# Patient Record
Sex: Female | Born: 1990 | Race: White | Hispanic: No | Marital: Single
Health system: Southern US, Community
[De-identification: ages and names within clinical notes are randomized; demographics above are authoritative.]

## PROBLEM LIST (undated history)

## (undated) HISTORY — PX: BACK SURGERY: SHX140

---

## 2018-11-27 ENCOUNTER — Emergency Department (HOSPITAL_COMMUNITY): Payer: No Typology Code available for payment source

## 2018-11-27 ENCOUNTER — Emergency Department (HOSPITAL_COMMUNITY)
Admission: EM | Admit: 2018-11-27 | Discharge: 2018-11-27 | Disposition: A | Discharge status: Home / Self Care | Payer: No Typology Code available for payment source | Attending: Emergency Medicine | Admitting: Emergency Medicine

## 2018-11-27 ENCOUNTER — Encounter (HOSPITAL_COMMUNITY): Payer: Self-pay | Admitting: Emergency Medicine

## 2018-11-27 DIAGNOSIS — M545 Low back pain, unspecified: Secondary | ICD-10-CM

## 2018-11-27 DIAGNOSIS — R509 Fever, unspecified: Secondary | ICD-10-CM | POA: Insufficient documentation

## 2018-11-27 DIAGNOSIS — M546 Pain in thoracic spine: Secondary | ICD-10-CM | POA: Insufficient documentation

## 2018-11-27 LAB — CBC WITH DIFFERENTIAL/PLATELET
Abs Immature Granulocytes: 0.01 10*3/uL (ref 0.00–0.07)
Basophils Absolute: 0 10*3/uL (ref 0.0–0.1)
Basophils Relative: 0 %
Eosinophils Absolute: 0.2 10*3/uL (ref 0.0–0.5)
Eosinophils Relative: 2 %
HEMATOCRIT: 38 % (ref 36.0–46.0)
Hemoglobin: 12.3 g/dL (ref 12.0–15.0)
Immature Granulocytes: 0 %
LYMPHS ABS: 2.9 10*3/uL (ref 0.7–4.0)
Lymphocytes Relative: 43 %
MCH: 30.9 pg (ref 26.0–34.0)
MCHC: 32.4 g/dL (ref 30.0–36.0)
MCV: 95.5 fL (ref 80.0–100.0)
Monocytes Absolute: 0.4 10*3/uL (ref 0.1–1.0)
Monocytes Relative: 6 %
Neutro Abs: 3.2 10*3/uL (ref 1.7–7.7)
Neutrophils Relative %: 49 %
Platelets: 200 10*3/uL (ref 150–400)
RBC: 3.98 MIL/uL (ref 3.87–5.11)
RDW: 12.1 % (ref 11.5–15.5)
WBC: 6.7 10*3/uL (ref 4.0–10.5)
nRBC: 0 % (ref 0.0–0.2)

## 2018-11-27 LAB — I-STAT CG4 LACTIC ACID, ED
Lactic Acid, Venous: 0.36 mmol/L — ABNORMAL LOW (ref 0.5–1.9)
Lactic Acid, Venous: 0.43 mmol/L — ABNORMAL LOW (ref 0.5–1.9)

## 2018-11-27 LAB — COMPREHENSIVE METABOLIC PANEL
ALBUMIN: 3.9 g/dL (ref 3.5–5.0)
ALT: 10 U/L (ref 0–44)
AST: 14 U/L — ABNORMAL LOW (ref 15–41)
Alkaline Phosphatase: 44 U/L (ref 38–126)
Anion gap: 9 (ref 5–15)
BUN: 12 mg/dL (ref 6–20)
CO2: 22 mmol/L (ref 22–32)
Calcium: 9 mg/dL (ref 8.9–10.3)
Chloride: 107 mmol/L (ref 98–111)
Creatinine, Ser: 0.65 mg/dL (ref 0.44–1.00)
GFR calc Af Amer: >60 mL/min (ref >60)
GFR calc non Af Amer: >60 mL/min (ref >60)
Glucose, Bld: 87 mg/dL (ref 70–99)
Potassium: 3.9 mmol/L (ref 3.5–5.1)
Sodium: 138 mmol/L (ref 135–145)
Total Bilirubin: 0.9 mg/dL (ref 0.3–1.2)
Total Protein: 6.3 g/dL — ABNORMAL LOW (ref 6.5–8.1)

## 2018-11-27 LAB — SEDIMENTATION RATE: Sed Rate: 8 mm/hr (ref 0–22)

## 2018-11-27 LAB — C-REACTIVE PROTEIN: CRP: <0.8 mg/dL (ref <1.0)

## 2018-11-27 LAB — POC URINE PREG, ED: Preg Test, Ur: NEGATIVE

## 2018-11-27 MED ORDER — METHOCARBAMOL 1000 MG/10ML IJ SOLN
1000.0000 mg | Freq: Once | INTRAMUSCULAR | Status: DC
  Filled 2018-11-27 (×2): qty 10

## 2018-11-27 MED ORDER — OXYCODONE-ACETAMINOPHEN 5-325 MG PO TABS
1.0000 | ORAL_TABLET | Freq: Once | ORAL | Status: AC
  Administered 2018-11-27: 1 via ORAL
  Filled 2018-11-27: qty 1

## 2018-11-27 MED ORDER — HYDROMORPHONE HCL 1 MG/ML IJ SOLN
1.0000 mg | Freq: Once | INTRAMUSCULAR | Status: AC
  Administered 2018-11-27: 1 mg via INTRAMUSCULAR
  Filled 2018-11-27: qty 1

## 2018-11-27 MED ORDER — METHOCARBAMOL 500 MG PO TABS
1000.0000 mg | ORAL_TABLET | Freq: Once | ORAL | Status: AC
  Administered 2018-11-27: 1000 mg via ORAL

## 2018-11-27 MED ORDER — HYDROMORPHONE HCL 1 MG/ML IJ SOLN: 1.0000 mg | Freq: Once | INTRAMUSCULAR | Status: DC

## 2018-11-27 MED ORDER — METHOCARBAMOL 500 MG PO TABS: 500.0000 mg | ORAL_TABLET | Freq: Two times a day (BID) | ORAL | 0 refills | Status: AC

## 2018-11-27 MED ORDER — OXYCODONE-ACETAMINOPHEN 5-325 MG PO TABS: 1.0000 | ORAL_TABLET | Freq: Four times a day (QID) | ORAL | 0 refills | Status: AC | PRN

## 2018-11-27 MED ORDER — LIDOCAINE 5 % EX PTCH
1.0000 | MEDICATED_PATCH | CUTANEOUS | Status: DC
  Administered 2018-11-27: 1 via TRANSDERMAL
  Filled 2018-11-27: qty 1

## 2018-11-27 NOTE — ED Notes (Signed)
Pt verbalized understanding of discharge paperwork, prescriptions, and follow-up care. 
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
## 2018-11-27 NOTE — Discharge Instructions (Addendum)
Thank you for allowing me to care for you today in the Emergency Department.   Call to follow up appointment with the neurosurgery clinic.   Take 600 mg of ibuprofen with food or 650 mg of Tylenol every 6 hours.  You can also take 1 tablet of Robaxin, a muscle relaxer, 2 times daily.  For severe, uncontrollable pain, take 1 tablet of Percocet every 6 hours as needed for severe pain.  This is a narcotic.  It can be addicting.  Do not work or drive while taking this medication.  Lidocaine patches are available over-the-counter and can be applied to areas that are sore.  Apply heat or ice for 15 to 20 minutes as frequently as needed to see if your pain improves.  Do not use Robaxin and Percocet at the same time as both of these can cause you to be more drowsy.  Do not drink alcohol if you are taking Percocet.  Return to the emergency department if you develop high fevers that do not improve with Tylenol or ibuprofen, confusion, if you start peeing or pooping on herself, uncontrollable back pain, or other new, concerning symptoms.
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
## 2020-04-10 IMAGING — DX DG LUMBAR SPINE COMPLETE 4+V
5 series · 5 of 5 positions shown · non-contrast
Comparison: CT scan of November 08, 2016.

CLINICAL DATA: Lower back pain after injury today.

EXAM:
LUMBAR SPINE - COMPLETE 4+ VIEW

[t lumbar spine ap]
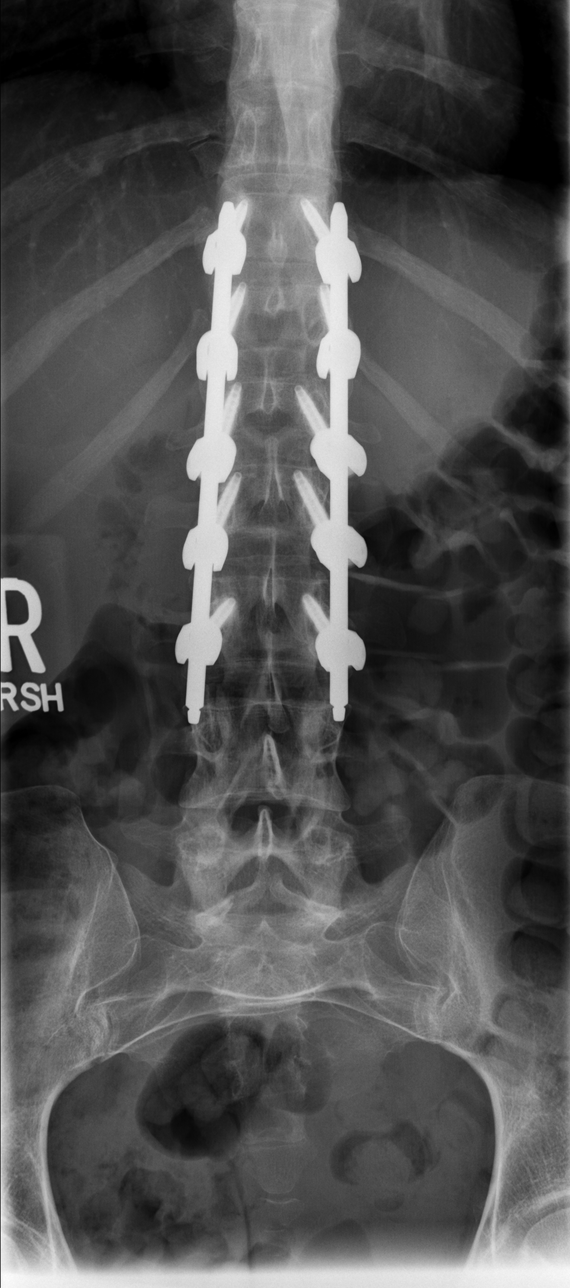

[t lumbar spine obl (1 of 2)]
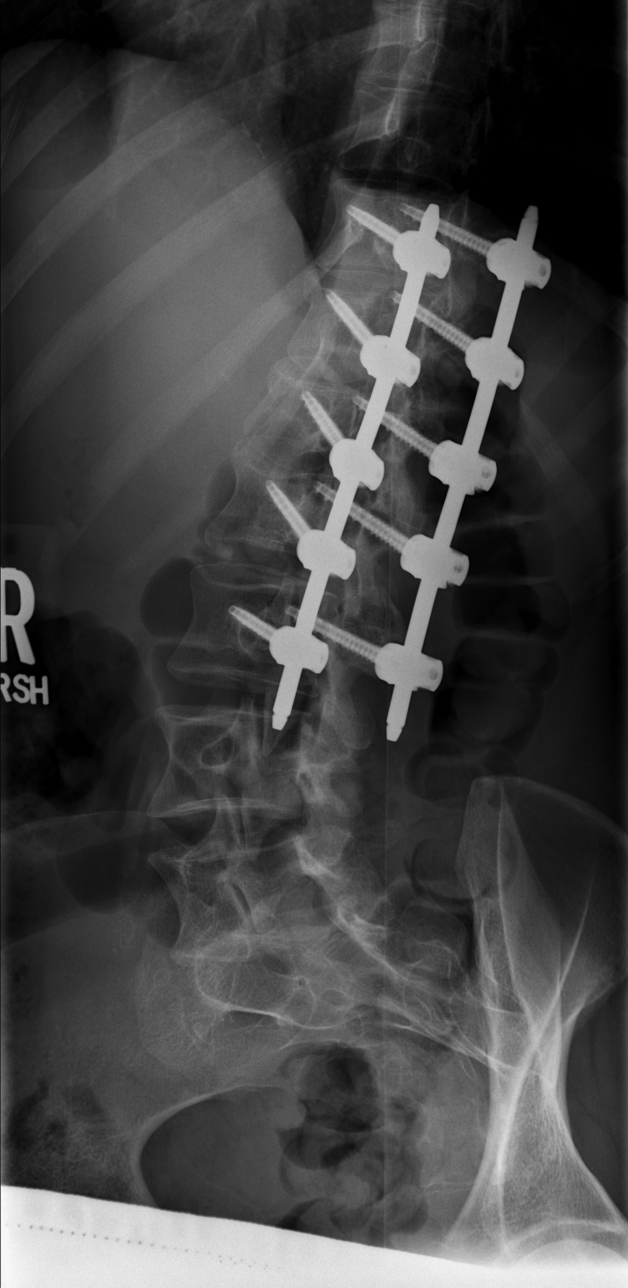

[t lumbar spine obl (2 of 2)]
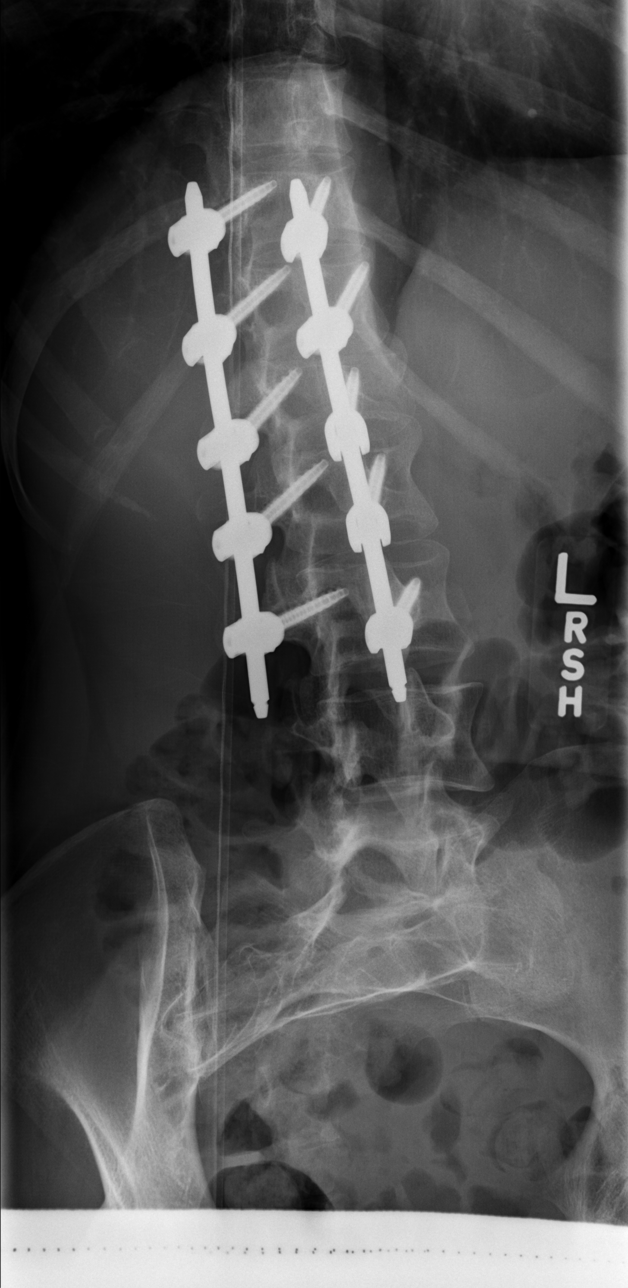

[t lumbar spine lat]
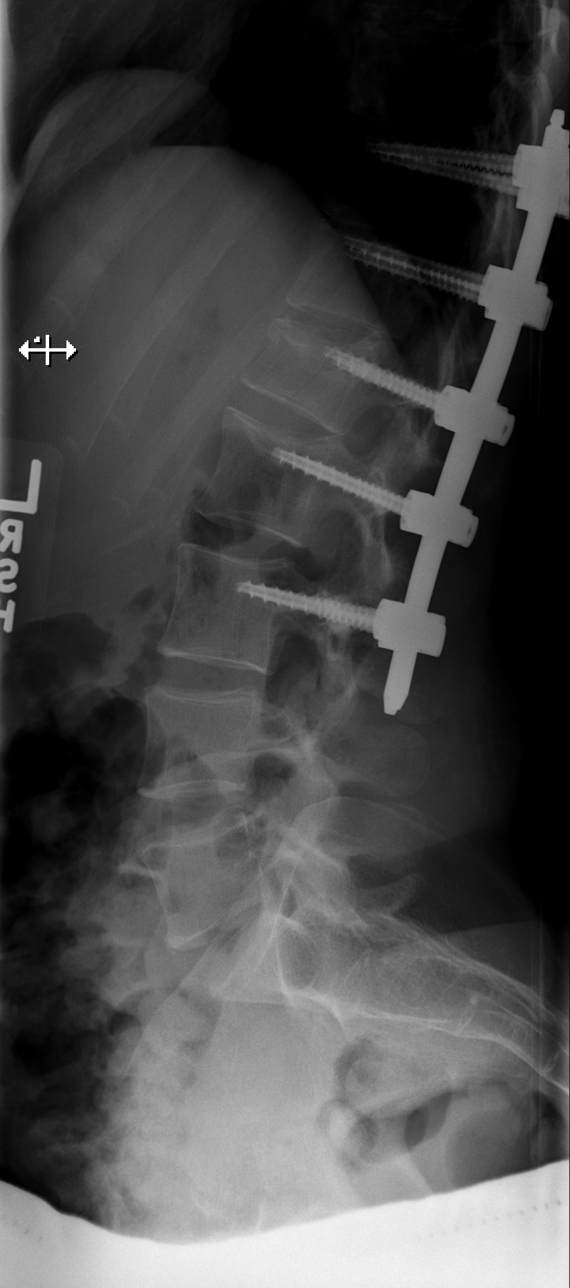

[t lumbar l-5 s-1 spot]
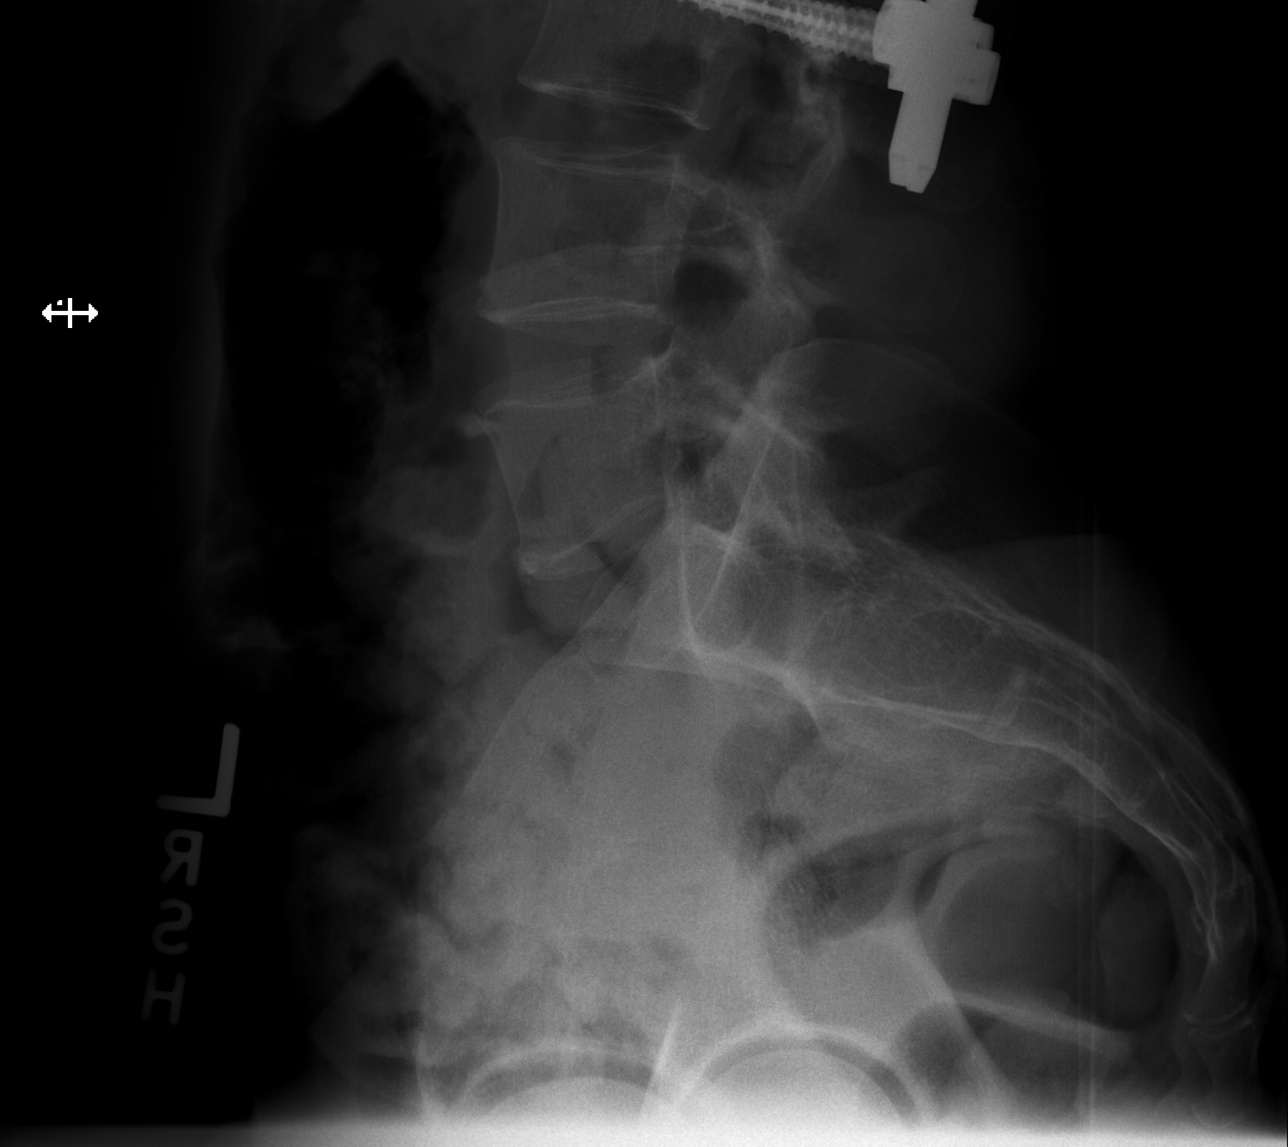

[5 of 5 positions shown; findings below may reference images not displayed]

FINDINGS: Status post surgical posterior fusion extending from T11-L3 with
bilateral intrapedicular screw placement. Old L1 compression
fracture is noted. No acute fracture or spondylolisthesis is noted.
Visualized disc spaces are unremarkable.
IMPRESSION: Postsurgical changes as described above. Old L1 compression fracture
is noted. No acute fracture or spondylolisthesis is noted.

## 2020-04-10 IMAGING — DX DG THORACIC SPINE 2V
3 series · 3 of 3 positions shown · non-contrast
Comparison: CT scan of November 08, 2016.

CLINICAL DATA: Thoracic back pain after injury today.

EXAM:
THORACIC SPINE 2 VIEWS

[t thoracic spine ap]
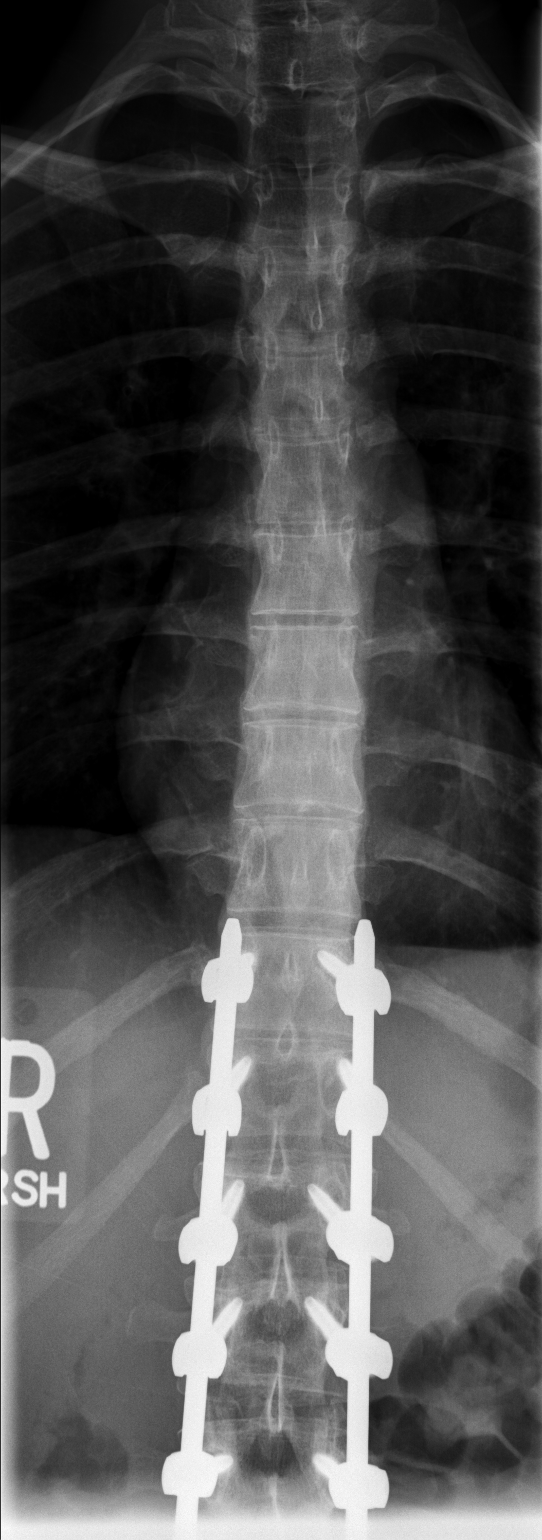

[t thoracic spine lat]
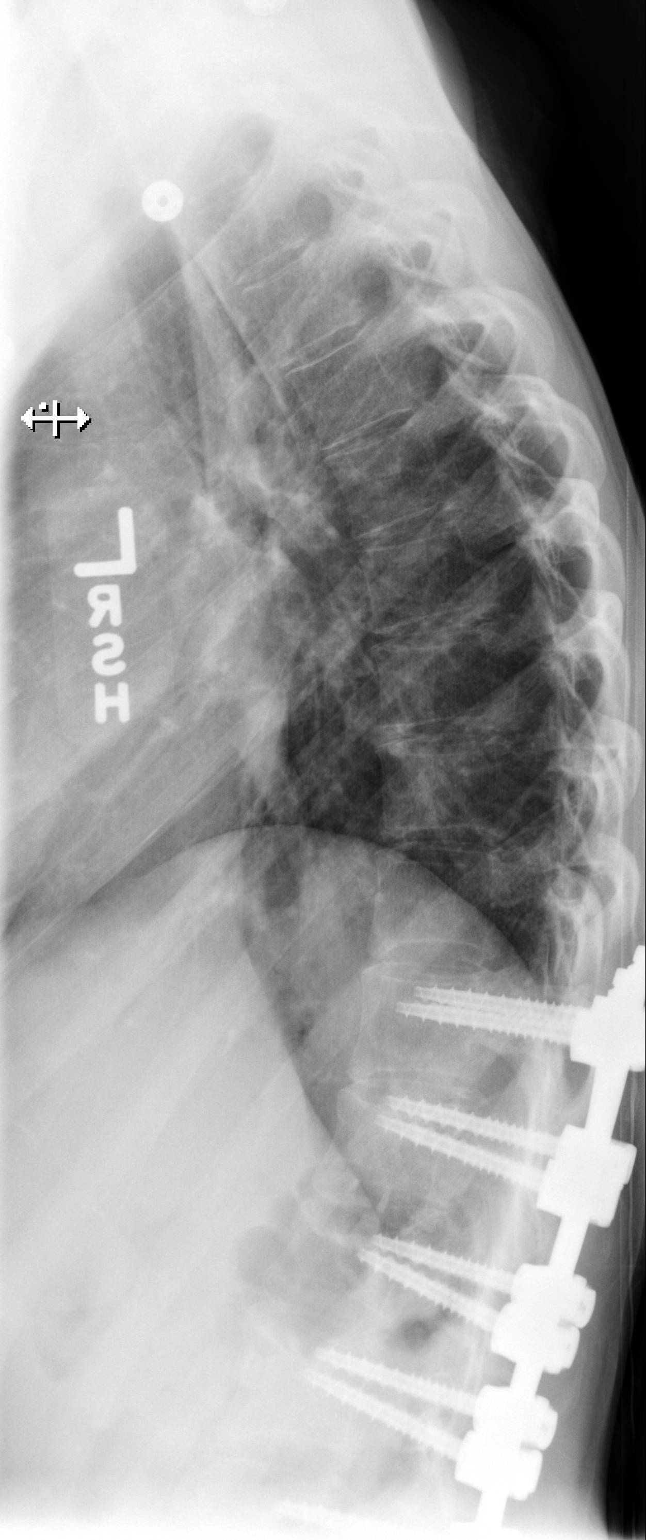

[t thoracic swimmers]
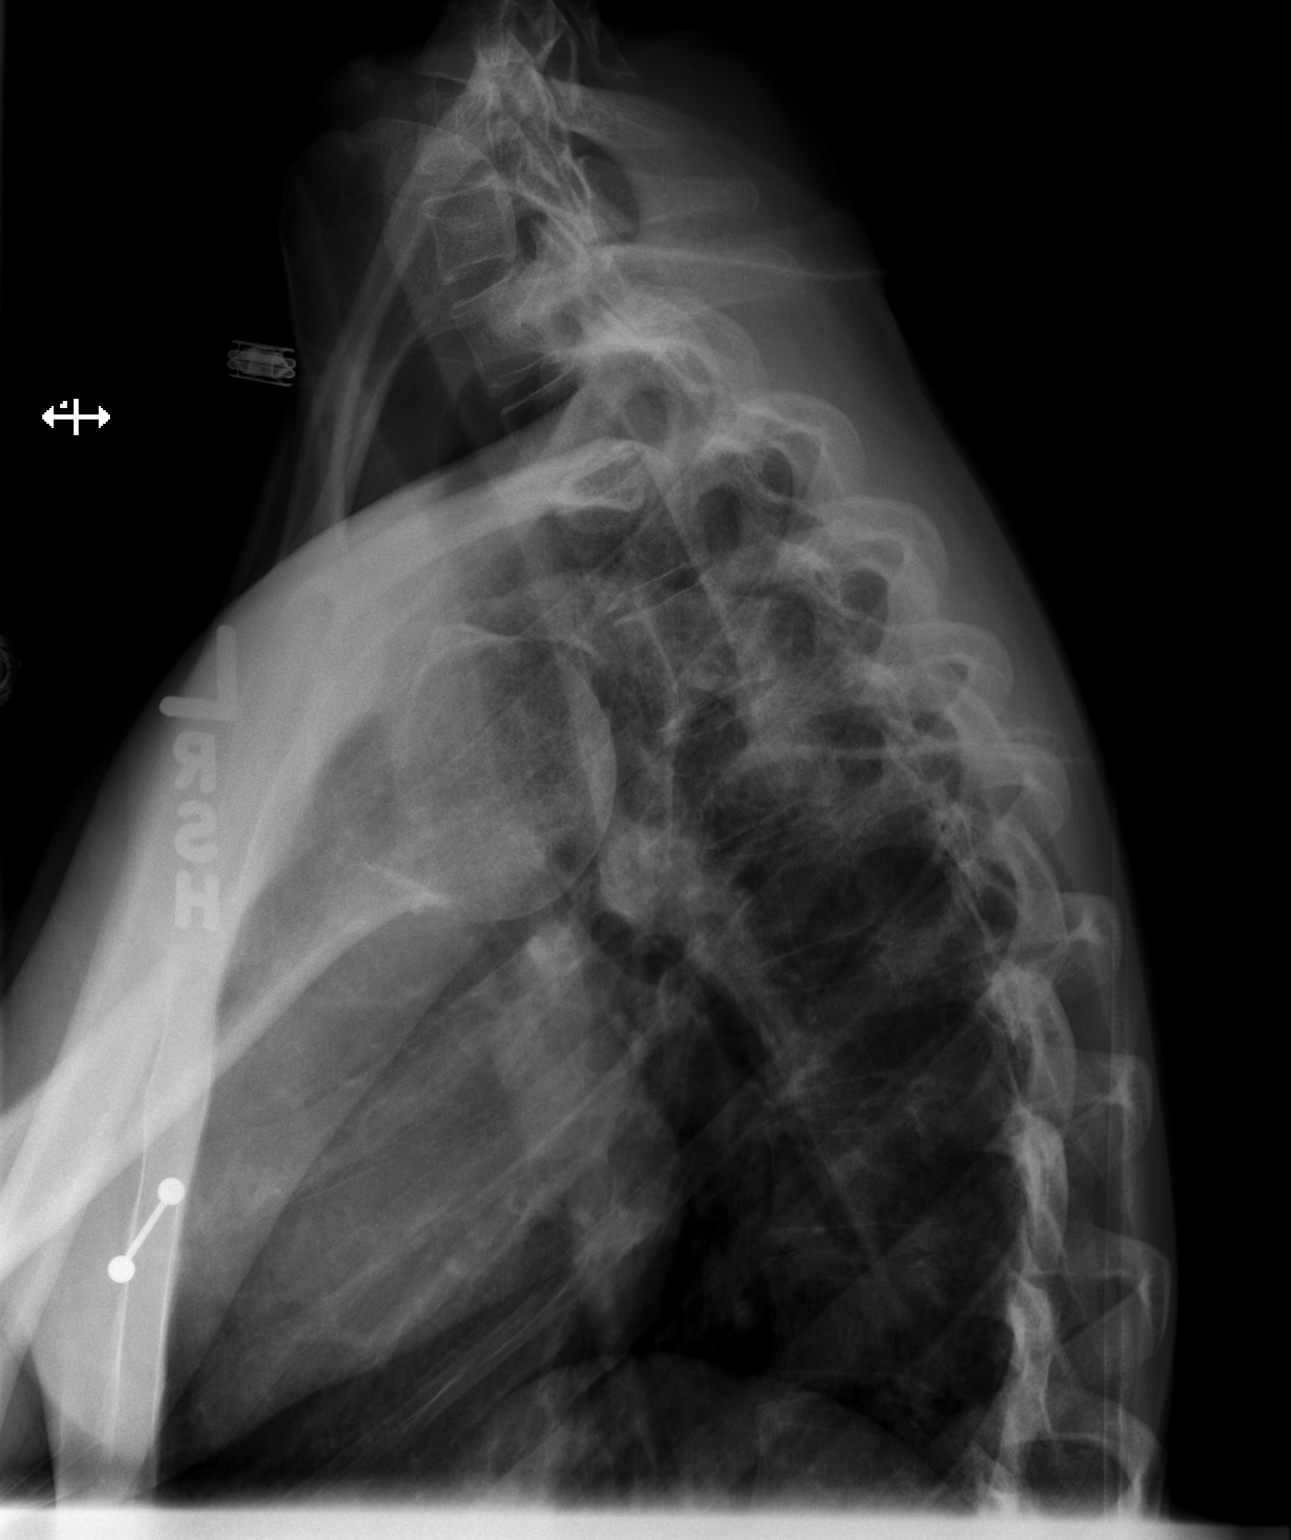

[3 of 3 positions shown; findings below may reference images not displayed]

FINDINGS: No fracture or spondylolisthesis is noted. Status post surgical
posterior fusion extending from T11 to L3. Disc spaces are
well-maintained.
IMPRESSION: No acute abnormality seen in the thoracic spine.
